# Patient Record
Sex: Female | Born: 2000
Health system: Southern US, Community
[De-identification: ages and names within clinical notes are randomized; demographics above are authoritative.]

---

## 2019-11-21 DIAGNOSIS — Z3041 Encounter for surveillance of contraceptive pills: Secondary | ICD-10-CM | POA: Diagnosis not present

## 2020-03-21 DIAGNOSIS — Z3041 Encounter for surveillance of contraceptive pills: Secondary | ICD-10-CM | POA: Diagnosis not present

## 2020-03-21 DIAGNOSIS — Z01419 Encounter for gynecological examination (general) (routine) without abnormal findings: Secondary | ICD-10-CM | POA: Diagnosis not present

## 2020-03-21 DIAGNOSIS — N898 Other specified noninflammatory disorders of vagina: Secondary | ICD-10-CM | POA: Diagnosis not present

## 2020-03-21 DIAGNOSIS — Z01411 Encounter for gynecological examination (general) (routine) with abnormal findings: Secondary | ICD-10-CM | POA: Diagnosis not present

## 2020-09-06 DIAGNOSIS — Z309 Encounter for contraceptive management, unspecified: Secondary | ICD-10-CM | POA: Diagnosis not present

## 2020-09-27 DIAGNOSIS — M25571 Pain in right ankle and joints of right foot: Secondary | ICD-10-CM | POA: Diagnosis not present

## 2020-10-07 DIAGNOSIS — R051 Acute cough: Secondary | ICD-10-CM | POA: Diagnosis not present

## 2020-10-07 DIAGNOSIS — Z20822 Contact with and (suspected) exposure to covid-19: Secondary | ICD-10-CM | POA: Diagnosis not present

## 2020-10-07 DIAGNOSIS — J069 Acute upper respiratory infection, unspecified: Secondary | ICD-10-CM | POA: Diagnosis not present

## 2020-11-08 DIAGNOSIS — J029 Acute pharyngitis, unspecified: Secondary | ICD-10-CM | POA: Diagnosis not present

## 2020-11-21 DIAGNOSIS — Z3041 Encounter for surveillance of contraceptive pills: Secondary | ICD-10-CM | POA: Diagnosis not present

## 2020-11-29 DIAGNOSIS — R509 Fever, unspecified: Secondary | ICD-10-CM | POA: Diagnosis not present

## 2020-11-29 DIAGNOSIS — U071 COVID-19: Secondary | ICD-10-CM | POA: Diagnosis not present

## 2020-11-29 DIAGNOSIS — R0981 Nasal congestion: Secondary | ICD-10-CM | POA: Diagnosis not present

## 2020-11-29 DIAGNOSIS — R058 Other specified cough: Secondary | ICD-10-CM | POA: Diagnosis not present

## 2020-11-29 DIAGNOSIS — J020 Streptococcal pharyngitis: Secondary | ICD-10-CM | POA: Diagnosis not present

## 2021-03-16 ENCOUNTER — Emergency Department (HOSPITAL_COMMUNITY)
Admission: EM | Admit: 2021-03-16 | Discharge: 2021-03-16 | Disposition: A | Discharge status: Home / Self Care | Payer: BC Managed Care – PPO | Attending: Emergency Medicine | Admitting: Emergency Medicine

## 2021-03-16 ENCOUNTER — Encounter (HOSPITAL_COMMUNITY): Payer: Self-pay

## 2021-03-16 ENCOUNTER — Emergency Department (HOSPITAL_COMMUNITY): Payer: BC Managed Care – PPO

## 2021-03-16 ENCOUNTER — Other Ambulatory Visit: Payer: Self-pay

## 2021-03-16 DIAGNOSIS — M7989 Other specified soft tissue disorders: Secondary | ICD-10-CM | POA: Diagnosis not present

## 2021-03-16 DIAGNOSIS — W500XXA Accidental hit or strike by another person, initial encounter: Secondary | ICD-10-CM | POA: Insufficient documentation

## 2021-03-16 DIAGNOSIS — Z23 Encounter for immunization: Secondary | ICD-10-CM | POA: Diagnosis not present

## 2021-03-16 DIAGNOSIS — S80211A Abrasion, right knee, initial encounter: Secondary | ICD-10-CM | POA: Insufficient documentation

## 2021-03-16 DIAGNOSIS — S0993XA Unspecified injury of face, initial encounter: Secondary | ICD-10-CM | POA: Diagnosis not present

## 2021-03-16 DIAGNOSIS — W19XXXA Unspecified fall, initial encounter: Secondary | ICD-10-CM

## 2021-03-16 DIAGNOSIS — S60511A Abrasion of right hand, initial encounter: Secondary | ICD-10-CM | POA: Insufficient documentation

## 2021-03-16 DIAGNOSIS — S60512A Abrasion of left hand, initial encounter: Secondary | ICD-10-CM | POA: Diagnosis not present

## 2021-03-16 DIAGNOSIS — R6884 Jaw pain: Secondary | ICD-10-CM | POA: Diagnosis not present

## 2021-03-16 DIAGNOSIS — Y9289 Other specified places as the place of occurrence of the external cause: Secondary | ICD-10-CM | POA: Diagnosis not present

## 2021-03-16 DIAGNOSIS — Y9302 Activity, running: Secondary | ICD-10-CM | POA: Diagnosis not present

## 2021-03-16 DIAGNOSIS — S0181XA Laceration without foreign body of other part of head, initial encounter: Secondary | ICD-10-CM

## 2021-03-16 DIAGNOSIS — M25572 Pain in left ankle and joints of left foot: Secondary | ICD-10-CM | POA: Diagnosis not present

## 2021-03-16 DIAGNOSIS — T07XXXA Unspecified multiple injuries, initial encounter: Secondary | ICD-10-CM

## 2021-03-16 MED ORDER — LIDOCAINE-EPINEPHRINE (PF) 2 %-1:200000 IJ SOLN
20.0000 mL | Freq: Once | INTRAMUSCULAR | Status: AC
  Administered 2021-03-16: 20 mL
  Filled 2021-03-16: qty 20

## 2021-03-16 MED ORDER — TETANUS-DIPHTH-ACELL PERTUSSIS 5-2.5-18.5 LF-MCG/0.5 IM SUSY
0.5000 mL | PREFILLED_SYRINGE | Freq: Once | INTRAMUSCULAR | Status: AC
  Administered 2021-03-16: 0.5 mL via INTRAMUSCULAR
  Filled 2021-03-16: qty 0.5

## 2021-03-16 NOTE — ED Triage Notes (Signed)
Patient arrived from a party, states everyone was running and she tripped and someone stepped on her. Scrapes noted to chin, lip, right hand and right knee. Also complaints of jaw and left ankle pain.

## 2021-03-16 NOTE — ED Provider Notes (Signed)
[facility]-EMERGENCY DEPT Provider Note   CSN: [id number] Arrival date & time: 03/16/21  0139     History Chief Complaint  Patient presents with  . Fall    Bridget Smith is a 20 y.o. female.  Patient presents to the emergency department with a chief complaint of fall.  She states that she was at a party, and everyone started running, she tripped, rolling her left ankle.  She states that she scraped her face, chin, and right knee.  She complains of jaw pain and left ankle pain.  She thinks someone may have stepped on her.  Last tetanus shot is unknown.  Denies any treatments prior to arrival.  The history is provided by the patient. No language interpreter was used.       History reviewed. No pertinent past medical history.  There are no problems to display for this patient.   History reviewed. No pertinent surgical history.   OB History   No obstetric history on file.     No family history on file.     Home Medications Prior to Admission medications   Not on File    Allergies    Patient has no known allergies.  Review of Systems   Review of Systems  All other systems reviewed and are negative.   Physical Exam Updated Vital Signs BP (!) 134/111 (BP Location: Left Arm)   Pulse (!) 119   Temp 98.1 F (36.7 C) (Oral)   Resp 20   LMP 02/28/2021   SpO2 100%   Physical Exam Vitals and nursing note reviewed.  Constitutional:      General: She is not in acute distress.    Appearance: She is well-developed.  HENT:     Head: Normocephalic and atraumatic.     Comments: 1 cm laceration in the chin, abrasion to upper lip, no dental trauma, normal range of motion of the mandible Eyes:     Conjunctiva/sclera: Conjunctivae normal.  Cardiovascular:     Rate and Rhythm: Normal rate and regular rhythm.     Heart sounds: No murmur heard.   Pulmonary:     Effort: Pulmonary effort is normal. No respiratory distress.     Breath sounds: Normal  breath sounds.  Abdominal:     Palpations: Abdomen is soft.     Tenderness: There is no abdominal tenderness.  Musculoskeletal:     Cervical back: Neck supple.  Skin:    General: Skin is warm and dry.     Comments: Abrasions to bilateral palms, right knee  Neurological:     Mental Status: She is alert and oriented to person, place, and time.  Psychiatric:        Mood and Affect: Mood normal.        Behavior: Behavior normal.     ED Results / Procedures / Treatments   Labs (all labs ordered are listed, but only abnormal results are displayed) Labs Reviewed - No data to display  EKG None  Radiology DG Mandible 1-3 Views  Result Date: 03/16/2021 CLINICAL DATA:  Fall, jaw pain EXAM: MANDIBLE - 1-3 VIEW COMPARISON:  None. FINDINGS: There is no evidence of fracture or other focal bone lesions. IMPRESSION: Negative. Electronically Signed   By: Charlett Nose M.D.   On: 03/16/2021 03:00   DG Ankle Complete Left  Result Date: 03/16/2021 CLINICAL DATA:  Rolled ankle EXAM: LEFT ANKLE COMPLETE - 3+ VIEW COMPARISON:  None. FINDINGS: Mild lateral soft tissue swelling. No acute bony  abnormality. Specifically, no fracture, subluxation, or dislocation. IMPRESSION: No acute bony abnormality. Electronically Signed   By: Charlett Nose M.D.   On: 03/16/2021 02:59    Procedures .Marland KitchenLaceration Repair  Date/Time: 03/16/2021 3:46 AM Performed by: Roxy Horseman, PA-C Authorized by: Roxy Horseman, PA-C    LACERATION REPAIR Performed by: Roxy Horseman Authorized by: Roxy Horseman Consent: Verbal consent obtained. Risks and benefits: risks, benefits and alternatives were discussed Consent given by: patient Patient identity confirmed: provided demographic data Prepped and Draped in normal sterile fashion Wound explored  Laceration Location: chin  Laceration Length: 1cm  No Foreign Bodies seen or palpated  Anesthesia: local infiltration  Local anesthetic: lidocaine 1% with  epinephrine  Anesthetic total: 2 ml  Irrigation method: syringe Amount of cleaning: standard  Skin closure: 5-0 vicryl rapide  Number of sutures: 3  Technique: interrupted  Patient tolerance: Patient tolerated the procedure well with no immediate complications.  Medications Ordered in ED Medications  lidocaine-EPINEPHrine (XYLOCAINE W/EPI) 2 %-1:200000 (PF) injection 20 mL (has no administration in time range)  Tdap (BOOSTRIX) injection 0.5 mL (has no administration in time range)    ED Course  I have reviewed the triage vital signs and the nursing notes.  Pertinent labs & imaging results that were available during my care of the patient were reviewed by me and considered in my medical decision making (see chart for details).    MDM Rules/Calculators/A&P                         Patient presents to the emergency department with a chief complaint of fall.  She states that she was at a party tonight, everyone started running, and while she was running she tripped and fell.  She rolled her left ankle.  She sustained multiple abrasions.  She also sustained a small chin laceration.  Imaging is negative.  Tetanus shot updated.  Laceration repaired.  Wounds are dressed and cleansed in the ED.  Discharged home.  PCP follow-up.  Final Clinical Impression(s) / ED Diagnoses Final diagnoses:  Fall, initial encounter  Multiple abrasions  Chin laceration, initial encounter    Rx / DC Orders ED Discharge Orders    None       Besan, Ketchem, PA-C 03/16/21 0346    Molpus, Jonny Ruiz, MD 03/16/21 [phone]

## 2021-03-16 NOTE — ED Notes (Signed)
Pt verbalized understanding of d/c and follow up care. 

## 2021-03-16 NOTE — ED Notes (Signed)
Assisted pt to use bedside commode

## 2021-03-24 DIAGNOSIS — Z01419 Encounter for gynecological examination (general) (routine) without abnormal findings: Secondary | ICD-10-CM | POA: Diagnosis not present

## 2021-03-24 DIAGNOSIS — N921 Excessive and frequent menstruation with irregular cycle: Secondary | ICD-10-CM | POA: Diagnosis not present

## 2021-03-24 DIAGNOSIS — N898 Other specified noninflammatory disorders of vagina: Secondary | ICD-10-CM | POA: Diagnosis not present

## 2021-03-24 DIAGNOSIS — Z01411 Encounter for gynecological examination (general) (routine) with abnormal findings: Secondary | ICD-10-CM | POA: Diagnosis not present

## 2021-05-19 DIAGNOSIS — Z7251 High risk heterosexual behavior: Secondary | ICD-10-CM | POA: Diagnosis not present

## 2021-06-07 DIAGNOSIS — Z20822 Contact with and (suspected) exposure to covid-19: Secondary | ICD-10-CM | POA: Diagnosis not present

## 2021-06-07 DIAGNOSIS — R509 Fever, unspecified: Secondary | ICD-10-CM | POA: Diagnosis not present

## 2021-06-07 DIAGNOSIS — J301 Allergic rhinitis due to pollen: Secondary | ICD-10-CM | POA: Diagnosis not present

## 2021-06-10 DIAGNOSIS — F66 Other sexual disorders: Secondary | ICD-10-CM | POA: Diagnosis not present

## 2021-06-10 DIAGNOSIS — N898 Other specified noninflammatory disorders of vagina: Secondary | ICD-10-CM | POA: Diagnosis not present

## 2021-06-10 DIAGNOSIS — Z3041 Encounter for surveillance of contraceptive pills: Secondary | ICD-10-CM | POA: Diagnosis not present

## 2021-07-26 DIAGNOSIS — H52223 Regular astigmatism, bilateral: Secondary | ICD-10-CM | POA: Diagnosis not present

## 2021-08-26 DIAGNOSIS — Z7251 High risk heterosexual behavior: Secondary | ICD-10-CM | POA: Diagnosis not present

## 2021-08-26 DIAGNOSIS — R102 Pelvic and perineal pain: Secondary | ICD-10-CM | POA: Diagnosis not present

## 2021-08-26 DIAGNOSIS — R202 Paresthesia of skin: Secondary | ICD-10-CM | POA: Diagnosis not present

## 2021-08-26 DIAGNOSIS — N898 Other specified noninflammatory disorders of vagina: Secondary | ICD-10-CM | POA: Diagnosis not present

## 2021-08-26 DIAGNOSIS — Z113 Encounter for screening for infections with a predominantly sexual mode of transmission: Secondary | ICD-10-CM | POA: Diagnosis not present

## 2021-09-08 DIAGNOSIS — R451 Restlessness and agitation: Secondary | ICD-10-CM | POA: Diagnosis not present

## 2021-09-24 DIAGNOSIS — N898 Other specified noninflammatory disorders of vagina: Secondary | ICD-10-CM | POA: Diagnosis not present

## 2021-09-24 DIAGNOSIS — R102 Pelvic and perineal pain: Secondary | ICD-10-CM | POA: Diagnosis not present

## 2021-09-25 DIAGNOSIS — R102 Pelvic and perineal pain: Secondary | ICD-10-CM | POA: Diagnosis not present

## 2021-12-05 DIAGNOSIS — Z113 Encounter for screening for infections with a predominantly sexual mode of transmission: Secondary | ICD-10-CM | POA: Diagnosis not present

## 2022-01-27 DIAGNOSIS — R21 Rash and other nonspecific skin eruption: Secondary | ICD-10-CM | POA: Diagnosis not present

## 2022-01-27 DIAGNOSIS — L292 Pruritus vulvae: Secondary | ICD-10-CM | POA: Diagnosis not present

## 2022-01-27 DIAGNOSIS — K649 Unspecified hemorrhoids: Secondary | ICD-10-CM | POA: Diagnosis not present

## 2022-01-27 DIAGNOSIS — R238 Other skin changes: Secondary | ICD-10-CM | POA: Diagnosis not present

## 2022-01-31 DIAGNOSIS — Z202 Contact with and (suspected) exposure to infections with a predominantly sexual mode of transmission: Secondary | ICD-10-CM | POA: Diagnosis not present

## 2022-01-31 DIAGNOSIS — R21 Rash and other nonspecific skin eruption: Secondary | ICD-10-CM | POA: Diagnosis not present

## 2022-02-17 DIAGNOSIS — Z113 Encounter for screening for infections with a predominantly sexual mode of transmission: Secondary | ICD-10-CM | POA: Diagnosis not present

## 2022-02-24 DIAGNOSIS — Z113 Encounter for screening for infections with a predominantly sexual mode of transmission: Secondary | ICD-10-CM | POA: Diagnosis not present

## 2022-02-24 DIAGNOSIS — Z23 Encounter for immunization: Secondary | ICD-10-CM | POA: Diagnosis not present

## 2022-02-24 DIAGNOSIS — Z01419 Encounter for gynecological examination (general) (routine) without abnormal findings: Secondary | ICD-10-CM | POA: Diagnosis not present

## 2022-07-31 DIAGNOSIS — H5213 Myopia, bilateral: Secondary | ICD-10-CM | POA: Diagnosis not present

## 2022-11-09 DIAGNOSIS — J029 Acute pharyngitis, unspecified: Secondary | ICD-10-CM | POA: Diagnosis not present

## 2022-11-09 DIAGNOSIS — R509 Fever, unspecified: Secondary | ICD-10-CM | POA: Diagnosis not present

## 2022-11-09 DIAGNOSIS — U071 COVID-19: Secondary | ICD-10-CM | POA: Diagnosis not present

## 2022-11-09 DIAGNOSIS — R051 Acute cough: Secondary | ICD-10-CM | POA: Diagnosis not present

## 2022-12-20 DIAGNOSIS — E162 Hypoglycemia, unspecified: Secondary | ICD-10-CM | POA: Diagnosis not present

## 2022-12-20 DIAGNOSIS — R42 Dizziness and giddiness: Secondary | ICD-10-CM | POA: Diagnosis not present

## 2023-03-24 IMAGING — CR DG MANDIBLE 1-3V
3 series · 3 of 3 positions shown · non-contrast
Comparison: None.

CLINICAL DATA: Fall, jaw pain

EXAM:
MANDIBLE - 1-3 VIEW

[w mandible pa]
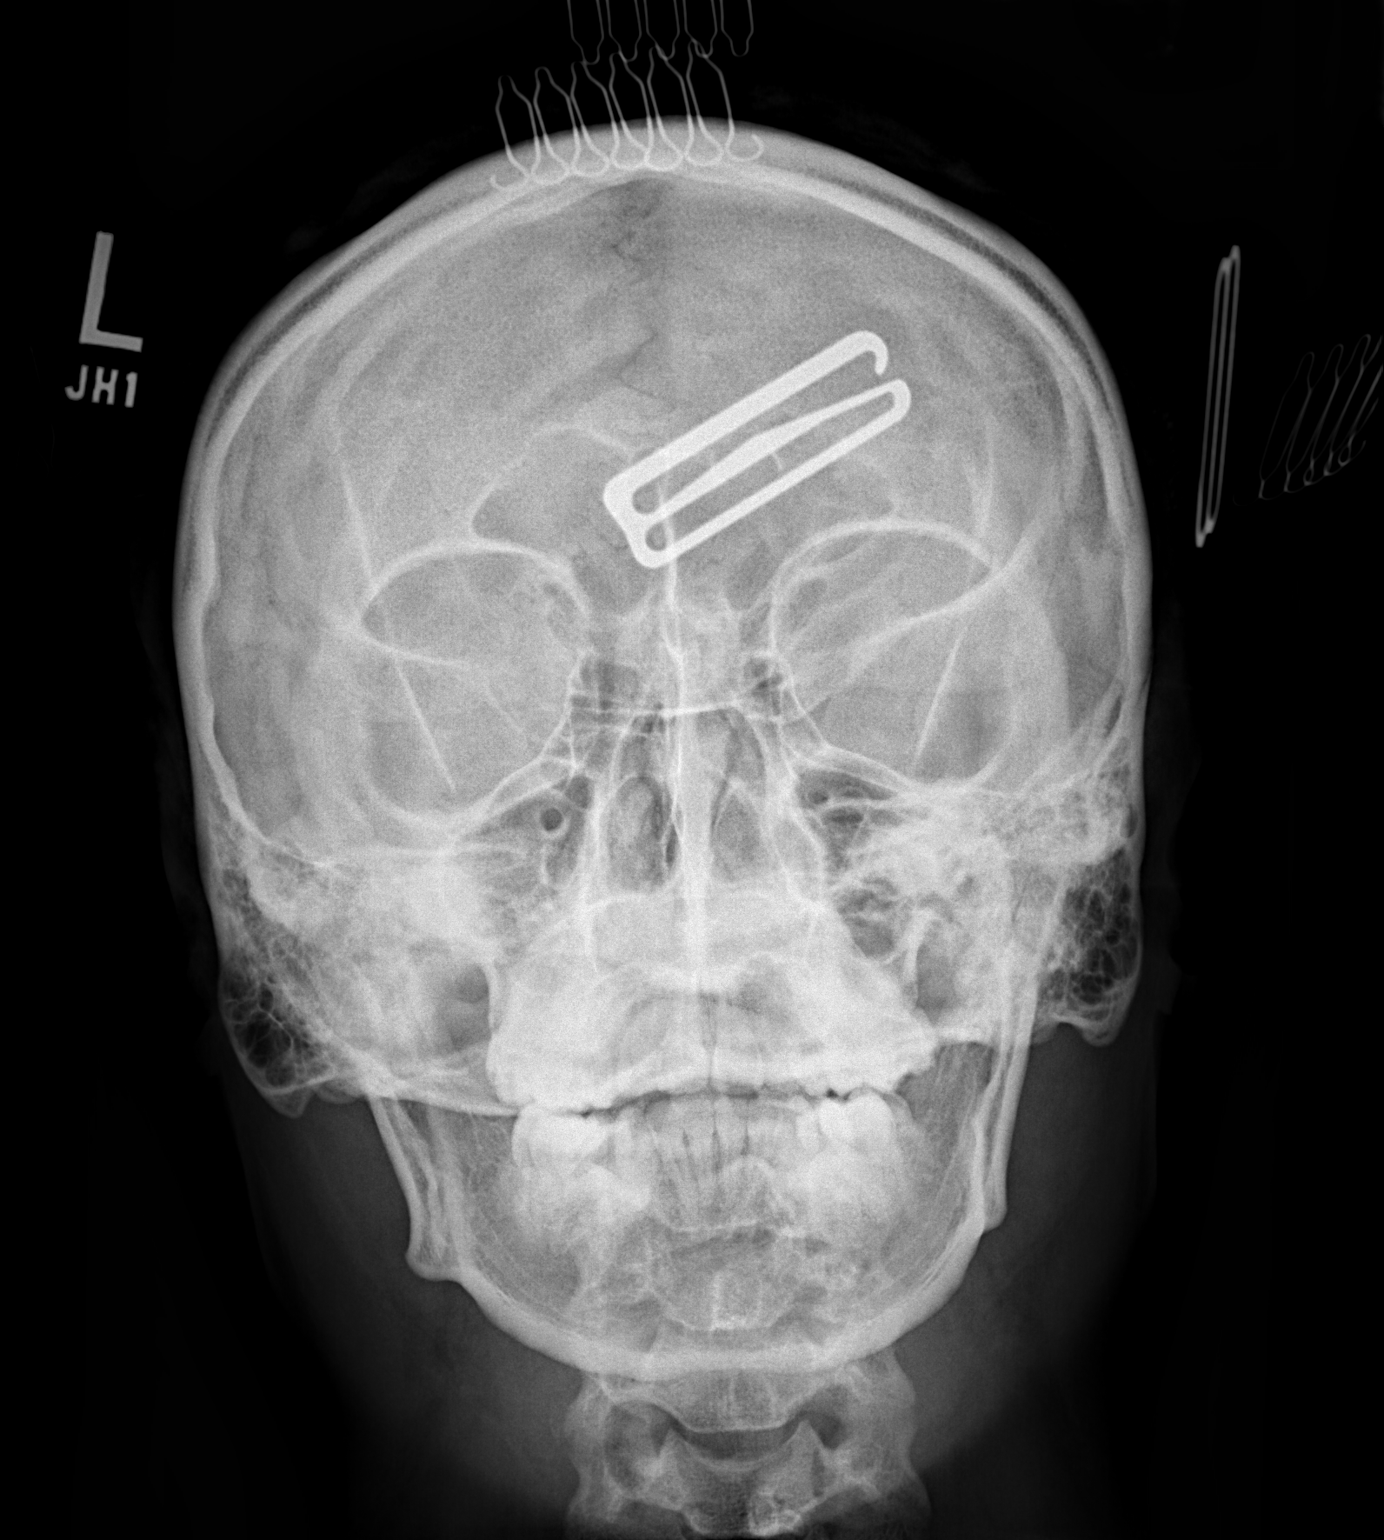

[w mandible obl (1 of 2)]
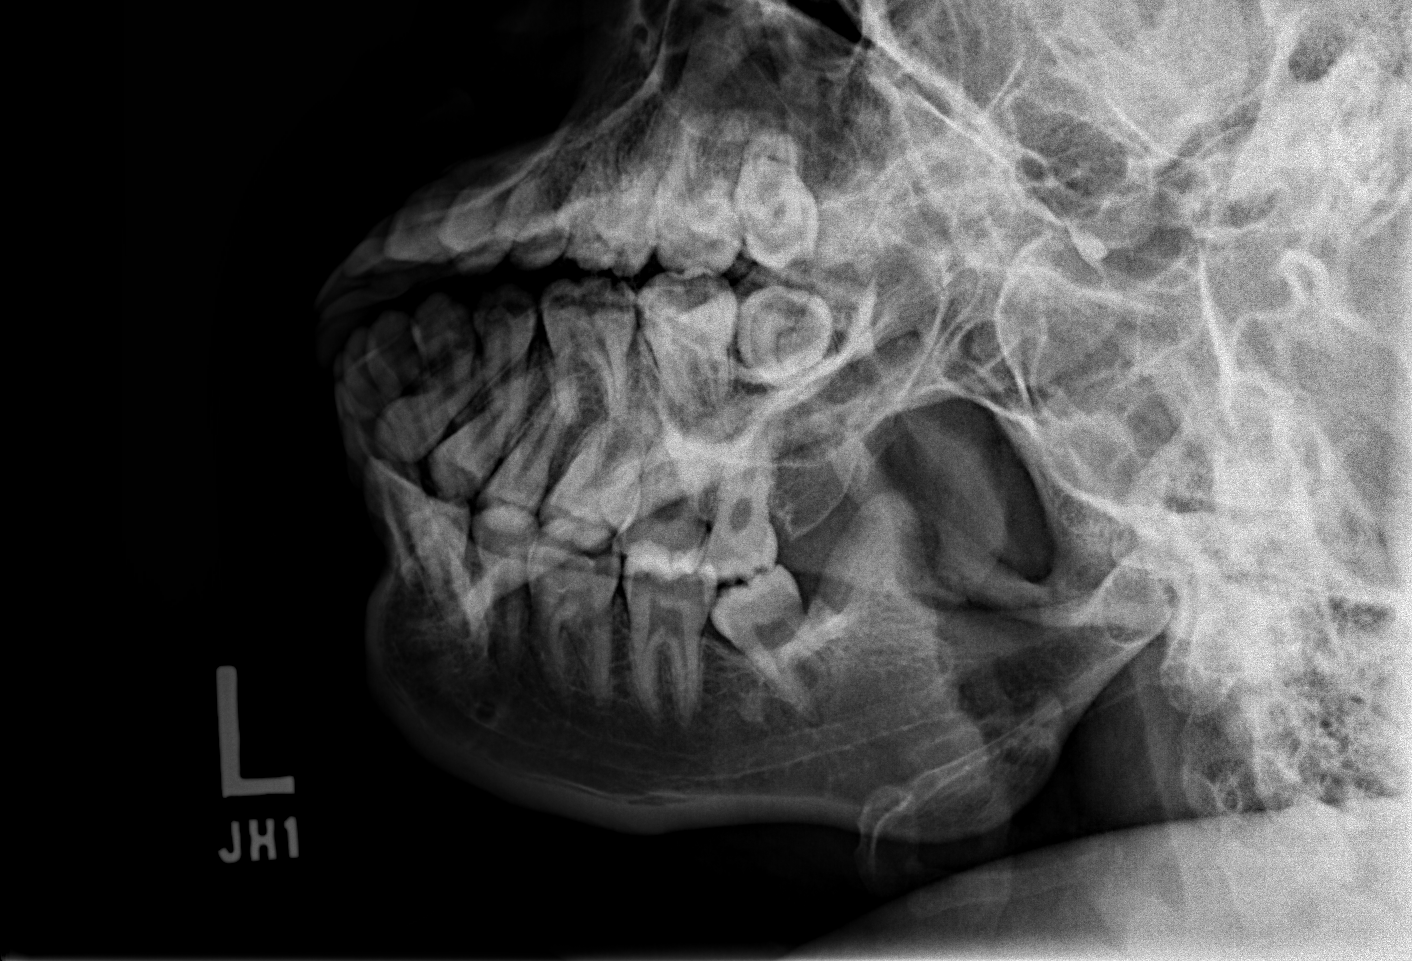

[w mandible obl (2 of 2)]
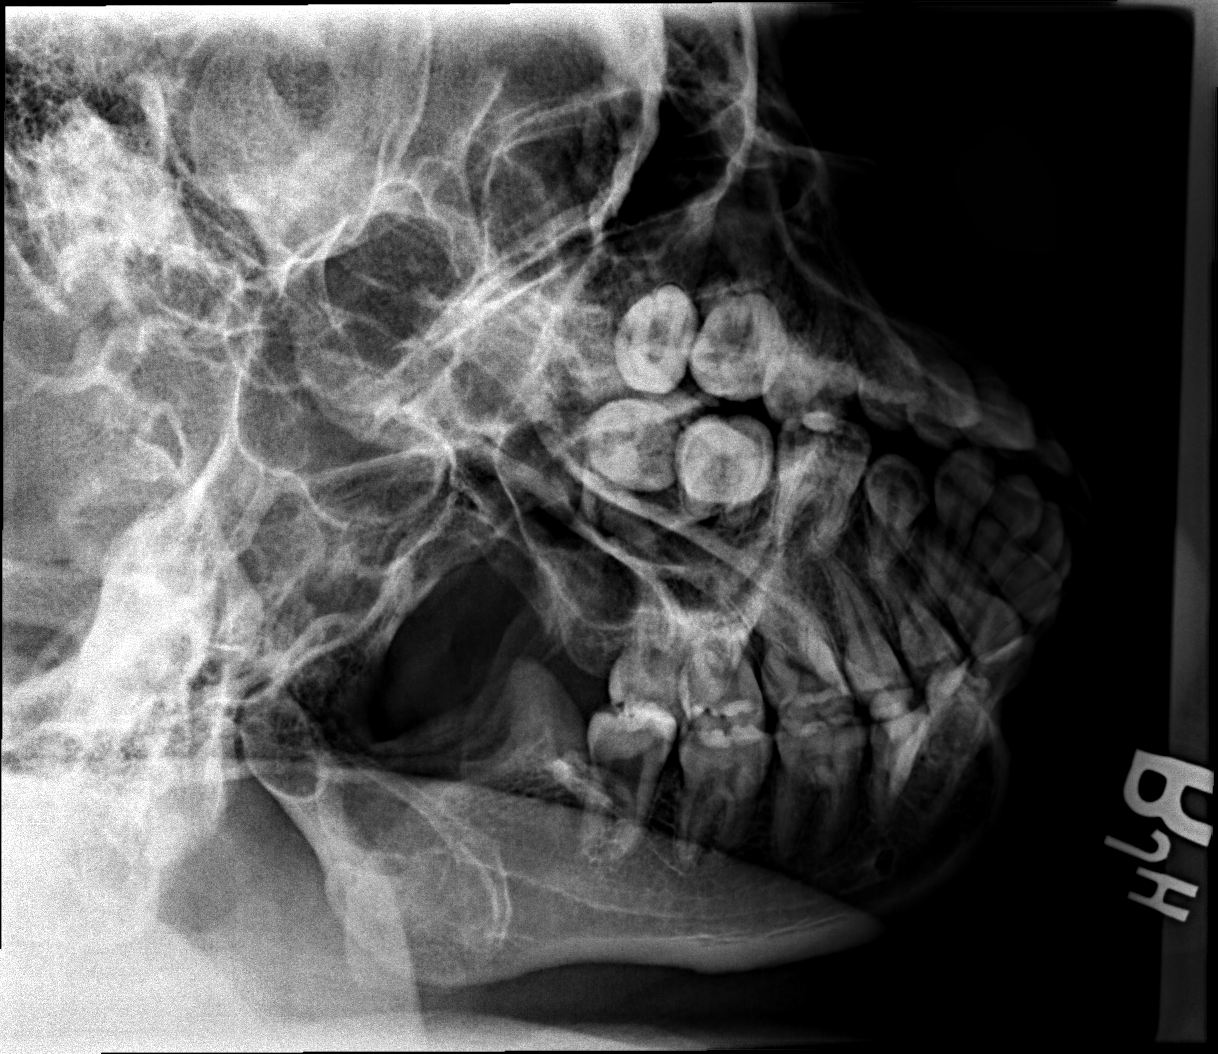

[3 of 3 positions shown; findings below may reference images not displayed]

FINDINGS: There is no evidence of fracture or other focal bone lesions.
IMPRESSION: Negative.

## 2023-03-24 IMAGING — CR DG ANKLE COMPLETE 3+V*L*
3 series · 3 of 3 positions shown · non-contrast
Comparison: None.

CLINICAL DATA: Rolled ankle

EXAM:
LEFT ANKLE COMPLETE - 3+ VIEW

[x ankle ap left]
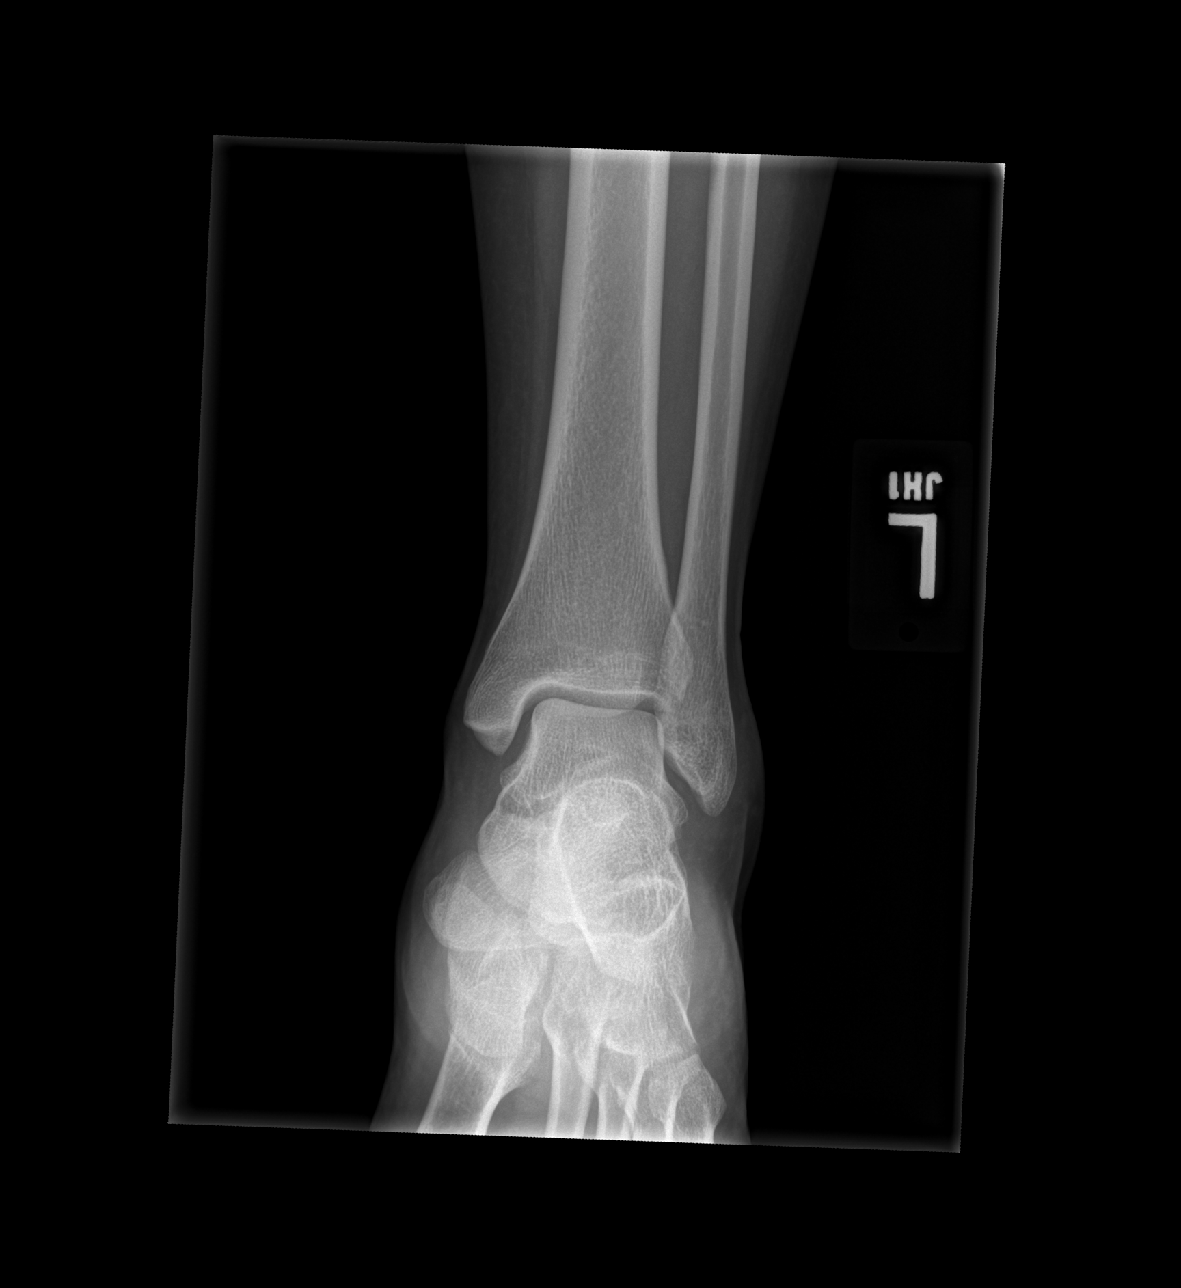

[x ankle obl left]
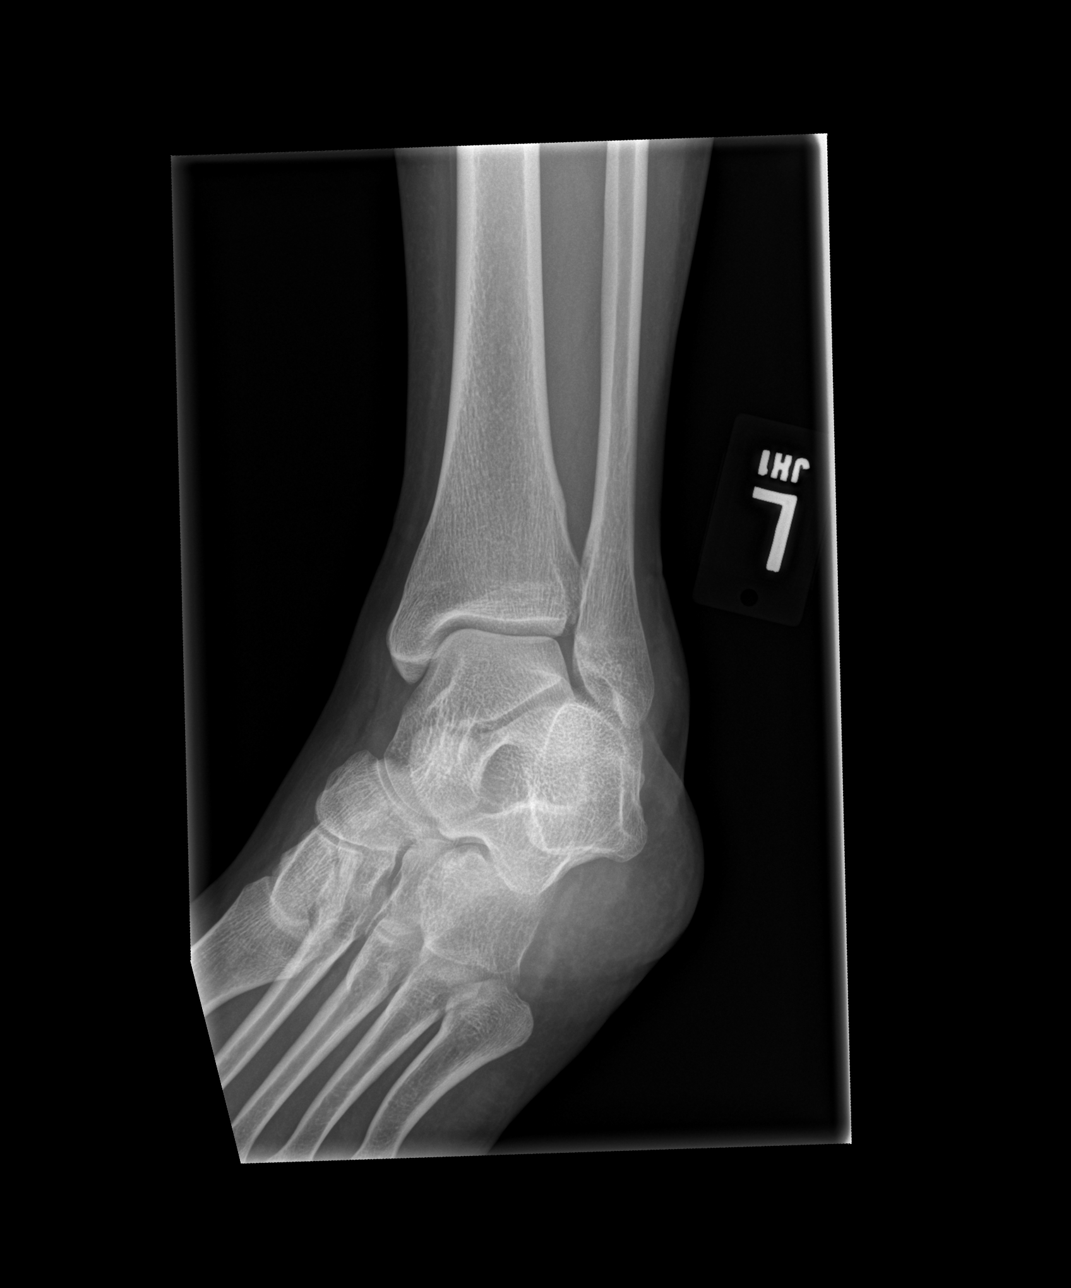

[x ankle lat left]
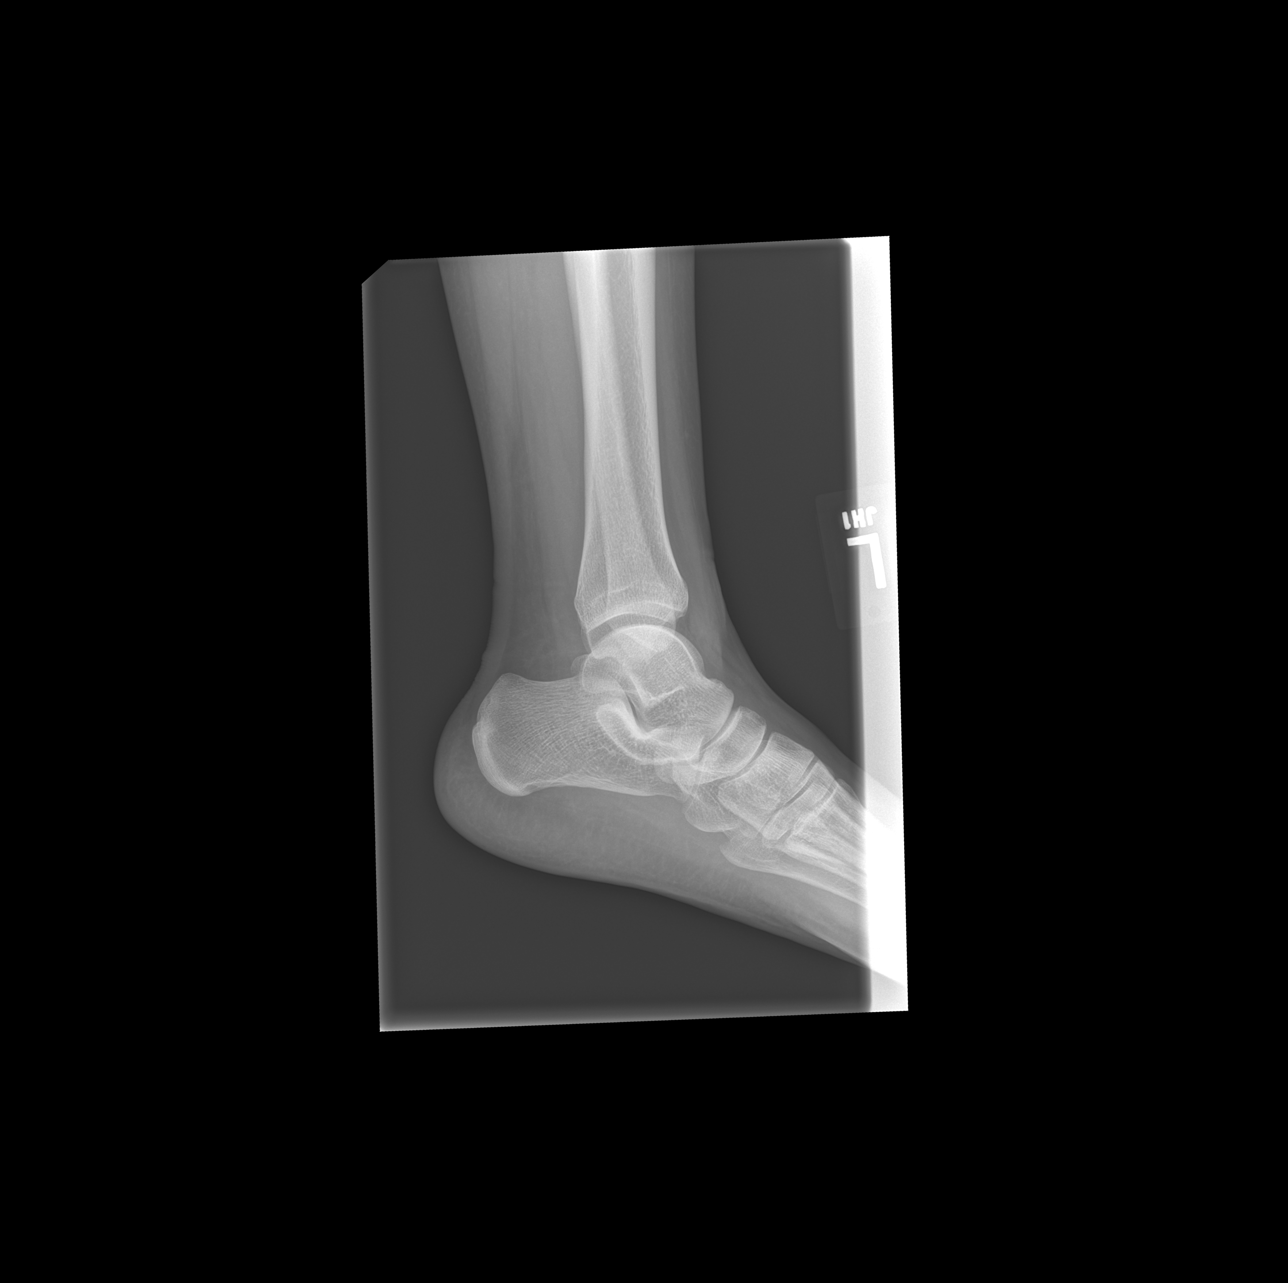

[3 of 3 positions shown; findings below may reference images not displayed]

FINDINGS: Mild lateral soft tissue swelling. No acute bony abnormality.
Specifically, no fracture, subluxation, or dislocation.
IMPRESSION: No acute bony abnormality.

## 2023-08-06 DIAGNOSIS — H5213 Myopia, bilateral: Secondary | ICD-10-CM | POA: Diagnosis not present

## 2023-09-20 DIAGNOSIS — Y92410 Unspecified street and highway as the place of occurrence of the external cause: Secondary | ICD-10-CM | POA: Diagnosis not present

## 2023-09-20 DIAGNOSIS — S22000A Wedge compression fracture of unspecified thoracic vertebra, initial encounter for closed fracture: Secondary | ICD-10-CM | POA: Diagnosis not present

## 2023-09-20 DIAGNOSIS — S22068A Other fracture of T7-T8 thoracic vertebra, initial encounter for closed fracture: Secondary | ICD-10-CM | POA: Diagnosis not present

## 2023-09-20 DIAGNOSIS — R1084 Generalized abdominal pain: Secondary | ICD-10-CM | POA: Diagnosis not present

## 2023-09-20 DIAGNOSIS — S22050A Wedge compression fracture of T5-T6 vertebra, initial encounter for closed fracture: Secondary | ICD-10-CM | POA: Diagnosis not present

## 2023-09-20 DIAGNOSIS — S22070A Wedge compression fracture of T9-T10 vertebra, initial encounter for closed fracture: Secondary | ICD-10-CM | POA: Diagnosis not present

## 2023-09-20 DIAGNOSIS — M545 Low back pain, unspecified: Secondary | ICD-10-CM | POA: Diagnosis not present

## 2023-09-20 DIAGNOSIS — R69 Illness, unspecified: Secondary | ICD-10-CM | POA: Diagnosis not present

## 2023-09-20 DIAGNOSIS — S22060A Wedge compression fracture of T7-T8 vertebra, initial encounter for closed fracture: Secondary | ICD-10-CM | POA: Diagnosis not present

## 2023-10-27 DIAGNOSIS — S22050D Wedge compression fracture of T5-T6 vertebra, subsequent encounter for fracture with routine healing: Secondary | ICD-10-CM | POA: Diagnosis not present

## 2023-10-27 DIAGNOSIS — S22060D Wedge compression fracture of T7-T8 vertebra, subsequent encounter for fracture with routine healing: Secondary | ICD-10-CM | POA: Diagnosis not present

## 2023-10-27 DIAGNOSIS — S22060A Wedge compression fracture of T7-T8 vertebra, initial encounter for closed fracture: Secondary | ICD-10-CM | POA: Diagnosis not present

## 2023-10-27 DIAGNOSIS — S22000A Wedge compression fracture of unspecified thoracic vertebra, initial encounter for closed fracture: Secondary | ICD-10-CM | POA: Diagnosis not present

## 2023-10-27 DIAGNOSIS — S22070A Wedge compression fracture of T9-T10 vertebra, initial encounter for closed fracture: Secondary | ICD-10-CM | POA: Diagnosis not present

## 2023-10-27 DIAGNOSIS — S22070D Wedge compression fracture of T9-T10 vertebra, subsequent encounter for fracture with routine healing: Secondary | ICD-10-CM | POA: Diagnosis not present
